# Patient Record
Sex: Female | Born: 1960 | Race: White | Hispanic: No | Marital: Married | Smoking: Never smoker
Health system: Southern US, Community
[De-identification: ages and names within clinical notes are randomized; demographics above are authoritative.]

## PROBLEM LIST (undated history)

## (undated) DIAGNOSIS — I10 Essential (primary) hypertension: Secondary | ICD-10-CM

## (undated) DIAGNOSIS — M797 Fibromyalgia: Secondary | ICD-10-CM

---

## 2011-08-17 ENCOUNTER — Encounter (HOSPITAL_COMMUNITY): Payer: Self-pay | Admitting: *Deleted

## 2011-08-17 ENCOUNTER — Emergency Department (HOSPITAL_COMMUNITY)
Admission: EM | Admit: 2011-08-17 | Discharge: 2011-08-17 | Disposition: A | Discharge status: Home / Self Care | Payer: Federal, State, Local not specified - PPO | Attending: Emergency Medicine | Admitting: Emergency Medicine

## 2011-08-17 ENCOUNTER — Other Ambulatory Visit: Payer: Self-pay

## 2011-08-17 ENCOUNTER — Emergency Department (HOSPITAL_COMMUNITY): Payer: Federal, State, Local not specified - PPO

## 2011-08-17 DIAGNOSIS — R61 Generalized hyperhidrosis: Secondary | ICD-10-CM | POA: Insufficient documentation

## 2011-08-17 DIAGNOSIS — J45909 Unspecified asthma, uncomplicated: Secondary | ICD-10-CM | POA: Insufficient documentation

## 2011-08-17 DIAGNOSIS — R079 Chest pain, unspecified: Secondary | ICD-10-CM | POA: Insufficient documentation

## 2011-08-17 DIAGNOSIS — I10 Essential (primary) hypertension: Secondary | ICD-10-CM | POA: Insufficient documentation

## 2011-08-17 DIAGNOSIS — Z79899 Other long term (current) drug therapy: Secondary | ICD-10-CM | POA: Insufficient documentation

## 2011-08-17 DIAGNOSIS — R51 Headache: Secondary | ICD-10-CM | POA: Insufficient documentation

## 2011-08-17 DIAGNOSIS — R1013 Epigastric pain: Secondary | ICD-10-CM | POA: Insufficient documentation

## 2011-08-17 DIAGNOSIS — M546 Pain in thoracic spine: Secondary | ICD-10-CM | POA: Insufficient documentation

## 2011-08-17 HISTORY — DX: Essential (primary) hypertension: I10

## 2011-08-17 HISTORY — DX: Fibromyalgia: M79.7

## 2011-08-17 LAB — CBC
HCT: 35.8 % — ABNORMAL LOW (ref 36.0–46.0)
Hemoglobin: 11.5 g/dL — ABNORMAL LOW (ref 12.0–15.0)
MCH: 27.6 pg (ref 26.0–34.0)
MCHC: 32.1 g/dL (ref 30.0–36.0)
MCV: 85.9 fL (ref 78.0–100.0)
RBC: 4.17 MIL/uL (ref 3.87–5.11)

## 2011-08-17 LAB — BASIC METABOLIC PANEL
BUN: 13 mg/dL (ref 6–23)
Creatinine, Ser: 0.87 mg/dL (ref 0.50–1.10)
GFR calc non Af Amer: 77 mL/min — ABNORMAL LOW (ref >90)
Glucose, Bld: 120 mg/dL — ABNORMAL HIGH (ref 70–99)
Potassium: 4.2 mEq/L (ref 3.5–5.1)

## 2011-08-17 LAB — DIFFERENTIAL
Basophils Relative: 2 % — ABNORMAL HIGH (ref 0–1)
Eosinophils Absolute: 0 10*3/uL (ref 0.0–0.7)
Eosinophils Relative: 0 % (ref 0–5)
Lymphs Abs: 1.7 10*3/uL (ref 0.7–4.0)
Monocytes Absolute: 0.3 10*3/uL (ref 0.1–1.0)
Monocytes Relative: 5 % (ref 3–12)

## 2011-08-17 LAB — HEPATIC FUNCTION PANEL
AST: 21 U/L (ref 0–37)
Bilirubin, Direct: 0.1 mg/dL (ref 0.0–0.3)
Indirect Bilirubin: 0.3 mg/dL (ref 0.3–0.9)
Total Bilirubin: 0.4 mg/dL (ref 0.3–1.2)

## 2011-08-17 MED ORDER — ONDANSETRON HCL 4 MG/2ML IJ SOLN
4.0000 mg | Freq: Once | INTRAMUSCULAR | Status: AC
  Administered 2011-08-17: 4 mg via INTRAVENOUS
  Filled 2011-08-17: qty 2

## 2011-08-17 MED ORDER — SODIUM CHLORIDE 0.9 % IV SOLN: Freq: Once | INTRAVENOUS | Status: DC

## 2011-08-17 MED ORDER — PANTOPRAZOLE SODIUM 40 MG IV SOLR
40.0000 mg | Freq: Once | INTRAVENOUS | Status: AC
  Administered 2011-08-17: 40 mg via INTRAVENOUS
  Filled 2011-08-17: qty 40

## 2011-08-17 MED ORDER — ALBUTEROL SULFATE (5 MG/ML) 0.5% IN NEBU: 5.0000 mg | INHALATION_SOLUTION | Freq: Once | RESPIRATORY_TRACT | Status: DC

## 2011-08-17 MED ORDER — HYDROCODONE-ACETAMINOPHEN 5-325 MG PO TABS: 1.0000 | ORAL_TABLET | Freq: Four times a day (QID) | ORAL | Status: AC | PRN

## 2011-08-17 MED ORDER — MORPHINE SULFATE 4 MG/ML IJ SOLN
4.0000 mg | Freq: Once | INTRAMUSCULAR | Status: AC
  Administered 2011-08-17: 4 mg via INTRAVENOUS
  Filled 2011-08-17: qty 1

## 2011-08-17 MED ORDER — HYDROMORPHONE HCL PF 1 MG/ML IJ SOLN
INTRAMUSCULAR | Status: AC
  Administered 2011-08-17: 1 mg
  Filled 2011-08-17: qty 1

## 2011-08-17 NOTE — Discharge Instructions (Signed)
Follow up with your md as scheduled this week.  Increase your  prilosec to twice a day for 3 days

## 2011-08-17 NOTE — ED Notes (Signed)
Pt in xray

## 2011-08-17 NOTE — ED Notes (Signed)
Pt states CP began last night. Hurts worse with movement and taking a deep breath. Denies pain at present.

## 2011-08-17 NOTE — ED Provider Notes (Signed)
This chart was scribed for Benny Lennert, MD by Wallis Mart. The patient was seen in room APA12/[facility]12 and the patient's care was started at 11:59 AM.   CSN: [id number]  Arrival date & time 08/17/11  1122   First MD Initiated Contact with Patient 08/17/11 1148      Chief Complaint  Patient presents with  . Chest Pain    (Consider location/radiation/quality/duration/timing/severity/associated sxs/prior treatment) Patient is a 51 y.o. female presenting with chest pain. The history is provided by the patient.  Chest Pain The chest pain began yesterday. Chest pain occurs intermittently. The chest pain is improving. The pain is associated with breathing and exertion. The severity of the pain is moderate. The pain radiates to the mid back. Chest pain is worsened by deep breathing and exertion. Pertinent negatives for primary symptoms include no fever, no fatigue, no shortness of breath, no cough, no abdominal pain, no nausea, no vomiting and no dizziness.  Associated symptoms include diaphoresis.  Pertinent negatives for associated symptoms include no numbness. She tried aspirin for the symptoms. Risk factors include obesity and stress.  Her past medical history is significant for hypertension.  Pertinent negatives for past medical history include no seizures.      Charonda Hefter is a 51 y.o. female who presents to the Emergency Department complaining of sudden onset, episodic, gradually improving, moderate chest pain onset last night. The pain radiates to her back and worsens with movement and taking a deep breath.  Pt also c/o headache that started yesterday afternoon. Pt c/o diaphoresis upon waking up this morning. Pt w/ h/o fibromyalgia states that she has been doing too much physically, has not been able to sleep due to pain. Pt states that pain has subsided from last night, but is still uncomfortable.    PCP: Dr. Jonathon Jordan? and Dr. Merleen Milliner   Past Medical History  Diagnosis Date  .  Asthma   . Hypertension   . Fibromyalgia     Past Surgical History  Procedure Date  . Cesarean section     No family history on file.  History  Substance Use Topics  . Smoking status: Never Smoker   . Smokeless tobacco: Not on file  . Alcohol Use: Yes     Occ    OB History    Grav Para Term Preterm Abortions TAB SAB Ect Mult Living                  Review of Systems  Constitutional: Positive for diaphoresis. Negative for fever and fatigue.  HENT: Negative for congestion, sinus pressure and ear discharge.   Eyes: Negative for discharge.  Respiratory: Negative for cough and shortness of breath.   Cardiovascular: Positive for chest pain.  Gastrointestinal: Negative for nausea, vomiting, abdominal pain and diarrhea.  Genitourinary: Negative for frequency and hematuria.  Musculoskeletal: Negative for back pain.  Skin: Negative for rash.  Neurological: Negative for dizziness, seizures, numbness and headaches.  Hematological: Negative.   Psychiatric/Behavioral: Negative for hallucinations.  All other systems reviewed and are negative.    Allergies  Bee venom; Aspirin; and Demerol  Home Medications   Current Outpatient Rx  Name Route Sig Dispense Refill  . CETIRIZINE HCL 10 MG PO TABS Oral Take 10 mg by mouth every morning.    Marland Kitchen VITAMIN D 1000 UNITS PO TABS Oral Take 1,000 Units by mouth daily.    Marland Kitchen EPINEPHRINE 0.3 MG/0.3ML IJ DEVI Intramuscular Inject 0.3 mg into the muscle daily as needed. For bee  venom    . VITAMIN E 400 UNITS PO CAPS Oral Take 400 Units by mouth daily.      BP 159/99  Pulse 79  Temp 98.7 F (37.1 C)  Resp 17  Ht 5\' 1"  (1.549 m)  Wt 180 lb (81.647 kg)  BMI 34.01 kg/m2  SpO2 100%  LMP 08/17/2011  Physical Exam  Constitutional: She is oriented to person, place, and time. She appears well-developed.  HENT:  Head: Normocephalic and atraumatic.  Eyes: Conjunctivae and EOM are normal. No scleral icterus.  Neck: Neck supple. No thyromegaly  present.  Cardiovascular: Normal rate and regular rhythm.  Exam reveals no gallop and no friction rub.   No murmur heard. Pulmonary/Chest: No stridor. She has no wheezes. She has no rales. She exhibits no tenderness.  Abdominal: She exhibits no distension. There is no tenderness. There is no rebound.       Mild epigastric tenderness  Musculoskeletal: Normal range of motion. She exhibits no edema.  Lymphadenopathy:    She has no cervical adenopathy.  Neurological: She is oriented to person, place, and time. Coordination normal.  Skin: No rash noted. No erythema.  Psychiatric: She has a normal mood and affect. Her behavior is normal.    ED Course  Procedures (including critical care time) DIAGNOSTIC STUDIES: Oxygen Saturation is 99% on room air, normal by my interpretation.    COORDINATION OF CARE:    Labs Reviewed  CBC - Abnormal; Notable for the following:    Hemoglobin 11.5 (*)    HCT 35.8 (*)    All other components within normal limits  DIFFERENTIAL - Abnormal; Notable for the following:    Basophils Relative 2 (*)    All other components within normal limits  BASIC METABOLIC PANEL - Abnormal; Notable for the following:    Glucose, Bld 120 (*)    GFR calc non Af Amer 77 (*)    GFR calc Af Amer 89 (*)    All other components within normal limits  HEPATIC FUNCTION PANEL - Abnormal; Notable for the following:    Albumin 3.2 (*)    All other components within normal limits  LIPASE, BLOOD   Dg Abd Acute W/chest  08/17/2011  *RADIOLOGY REPORT*  Clinical Data: Nausea and chest pain.  ACUTE ABDOMEN SERIES (ABDOMEN 2 VIEW & CHEST 1 VIEW)  Comparison: None.  Findings: Borderline cardiomegaly is present.  The lungs are clear.  Supine and upright views of the abdomen demonstrate nonspecific bowel gas pattern.  There is no evidence for obstruction or free air.  The axial skeleton is unremarkable.  IMPRESSION:  1.  Borderline cardiomegaly without failure. 2.  No acute abnormality of  the chest or abdomen.  Original Report Authenticated By: Jamesetta Orleans. MATTERN, M.D.     No diagnosis found.  Date: 08/17/2011  Rate:78  Rhythm: normal sinus rhythm  QRS Axis: normal  Intervals: normal  ST/T Wave abnormalities: normal  Conduction Disutrbances:none  Narrative Interpretation:   Old EKG Reviewed: none available     MDM  Fibromyalgia pain  The chart was scribed for me under my direct supervision.  I personally performed the history, physical, and medical decision making and all procedures in the evaluation of this patient.Benny Lennert, MD 08/17/11 1420

## 2018-12-10 ENCOUNTER — Other Ambulatory Visit: Payer: Self-pay | Admitting: *Deleted

## 2018-12-10 DIAGNOSIS — Z20822 Contact with and (suspected) exposure to covid-19: Secondary | ICD-10-CM

## 2018-12-11 LAB — NOVEL CORONAVIRUS, NAA: SARS-CoV-2, NAA: NOT DETECTED

## 2018-12-16 ENCOUNTER — Telehealth: Payer: Self-pay | Admitting: General Practice

## 2018-12-16 NOTE — Telephone Encounter (Signed)
Negative COVID results given. Patient results "NOT Detected." Caller expressed understanding. ° °

## 2020-04-20 ENCOUNTER — Emergency Department (HOSPITAL_COMMUNITY)
Admission: EM | Admit: 2020-04-20 | Discharge: 2020-04-20 | Disposition: A | Discharge status: Home / Self Care | Payer: Federal, State, Local not specified - PPO | Attending: Emergency Medicine | Admitting: Emergency Medicine

## 2020-04-20 ENCOUNTER — Other Ambulatory Visit: Payer: Self-pay

## 2020-04-20 ENCOUNTER — Emergency Department (HOSPITAL_COMMUNITY): Payer: Federal, State, Local not specified - PPO

## 2020-04-20 ENCOUNTER — Encounter (HOSPITAL_COMMUNITY): Payer: Self-pay | Admitting: *Deleted

## 2020-04-20 DIAGNOSIS — J45909 Unspecified asthma, uncomplicated: Secondary | ICD-10-CM | POA: Insufficient documentation

## 2020-04-20 DIAGNOSIS — I10 Essential (primary) hypertension: Secondary | ICD-10-CM | POA: Insufficient documentation

## 2020-04-20 DIAGNOSIS — Z79899 Other long term (current) drug therapy: Secondary | ICD-10-CM | POA: Insufficient documentation

## 2020-04-20 DIAGNOSIS — R079 Chest pain, unspecified: Secondary | ICD-10-CM | POA: Insufficient documentation

## 2020-04-20 DIAGNOSIS — R0789 Other chest pain: Secondary | ICD-10-CM

## 2020-04-20 DIAGNOSIS — Z7901 Long term (current) use of anticoagulants: Secondary | ICD-10-CM | POA: Diagnosis not present

## 2020-04-20 DIAGNOSIS — R0602 Shortness of breath: Secondary | ICD-10-CM | POA: Diagnosis present

## 2020-04-20 LAB — BASIC METABOLIC PANEL
Anion gap: 12 (ref 5–15)
BUN: 31 mg/dL — ABNORMAL HIGH (ref 6–20)
CO2: 28 mmol/L (ref 22–32)
Calcium: 9.9 mg/dL (ref 8.9–10.3)
Chloride: 101 mmol/L (ref 98–111)
Creatinine, Ser: 1.05 mg/dL — ABNORMAL HIGH (ref 0.44–1.00)
GFR, Estimated: >60 mL/min (ref >60)
Glucose, Bld: 98 mg/dL (ref 70–99)
Potassium: 4.5 mmol/L (ref 3.5–5.1)
Sodium: 141 mmol/L (ref 135–145)

## 2020-04-20 LAB — CBC
HCT: 40.4 % (ref 36.0–46.0)
Hemoglobin: 12.8 g/dL (ref 12.0–15.0)
MCH: 28.8 pg (ref 26.0–34.0)
MCHC: 31.7 g/dL (ref 30.0–36.0)
MCV: 90.8 fL (ref 80.0–100.0)
Platelets: 330 10*3/uL (ref 150–400)
RBC: 4.45 MIL/uL (ref 3.87–5.11)
RDW: 13.5 % (ref 11.5–15.5)
WBC: 8.1 10*3/uL (ref 4.0–10.5)
nRBC: 0 % (ref 0.0–0.2)

## 2020-04-20 LAB — TROPONIN I (HIGH SENSITIVITY)
Troponin I (High Sensitivity): 3 ng/L (ref <18)
Troponin I (High Sensitivity): 3 ng/L (ref <18)

## 2020-04-20 MED ORDER — ALBUTEROL SULFATE (2.5 MG/3ML) 0.083% IN NEBU: 2.5000 mg | INHALATION_SOLUTION | RESPIRATORY_TRACT | 2 refills | Status: AC | PRN

## 2020-04-20 MED ORDER — ALBUTEROL SULFATE HFA 108 (90 BASE) MCG/ACT IN AERS
2.0000 | INHALATION_SPRAY | RESPIRATORY_TRACT | Status: DC
  Administered 2020-04-20: 2 via RESPIRATORY_TRACT
  Filled 2020-04-20: qty 6.7

## 2020-04-20 MED ORDER — PREDNISONE 50 MG PO TABS: ORAL_TABLET | ORAL | 0 refills | Status: DC

## 2020-04-20 MED ORDER — ALBUTEROL SULFATE HFA 108 (90 BASE) MCG/ACT IN AERS: 2.0000 | INHALATION_SPRAY | Freq: Four times a day (QID) | RESPIRATORY_TRACT | 2 refills | Status: DC | PRN

## 2020-04-20 MED ORDER — PREDNISONE 50 MG PO TABS
60.0000 mg | ORAL_TABLET | Freq: Once | ORAL | Status: AC
  Administered 2020-04-20: 60 mg via ORAL
  Filled 2020-04-20: qty 1

## 2020-04-20 MED ORDER — PREDNISONE 50 MG PO TABS: ORAL_TABLET | ORAL | 0 refills | Status: AC

## 2020-04-20 MED ORDER — ALBUTEROL SULFATE HFA 108 (90 BASE) MCG/ACT IN AERS: 2.0000 | INHALATION_SPRAY | Freq: Four times a day (QID) | RESPIRATORY_TRACT | 2 refills | Status: AC | PRN

## 2020-04-20 NOTE — ED Notes (Signed)
Patient discharged to home.  All discharge instructions reviewed.  Patient verbalized understanding via teachback method.  Ambulatory out of ED.   °

## 2020-04-20 NOTE — ED Provider Notes (Signed)
[facility] EMERGENCY DEPARTMENT Provider Note   CSN: [id number] Arrival date & time: 04/20/20  1845     History Chief Complaint  Patient presents with  . Shortness of Breath    Holly Crosby is a 60 y.o. female.  Pt complains of pain in the right side of her chest and feeling short of breath  Pt was diagnosed with covid on 1/2.  Pt reports she has a history of bronchitis and asthma.  Pt is out of albuterol.    The history is provided by the patient. No language interpreter was used.  Shortness of Breath Severity:  Moderate Timing:  Constant Progression:  Worsening Chronicity:  New Relieved by:  Nothing Worsened by:  Nothing Ineffective treatments:  None tried Associated symptoms: chest pain   Risk factors: no recent alcohol use and no hx of PE/DVT    Pt reports she was treated with paxlovid.      Past Medical History:  Diagnosis Date  . Asthma   . Fibromyalgia   . Hypertension     There are no problems to display for this patient.   Past Surgical History:  Procedure Laterality Date  . CESAREAN SECTION       OB History   No obstetric history on file.     History reviewed. No pertinent family history.  Social History   Tobacco Use  . Smoking status: Never Smoker  . Smokeless tobacco: Never Used  Vaping Use  . Vaping Use: Never used  Substance Use Topics  . Alcohol use: Yes    Comment: Occ  . Drug use: No    Home Medications Prior to Admission medications   Medication Sig Start Date End Date Taking? Authorizing Provider  carvedilol (COREG) 25 MG tablet Take 25 mg by mouth 2 (two) times daily with a meal.    [provider]  cetirizine (ZYRTEC) 10 MG tablet Take 10 mg by mouth every morning.    [provider]  cholecalciferol (VITAMIN D) 1000 UNITS tablet Take 2,000 Units by mouth daily.     [provider]  EPINEPHrine (EPI-PEN) 0.3 mg/0.3 mL DEVI Inject 0.3 mg into the muscle daily as needed. For bee venom     [provider]  Fe Fum-FA-B Cmp-C-Zn-Mg-Mn-Cu (HEMATINIC PLUS VIT/MINERALS) 106-1 MG TABS Take 1 tablet by mouth 2 (two) times daily.    [provider]  FLUoxetine (PROZAC) 20 MG capsule Take 20 mg by mouth daily.    [provider]  Fluticasone-Salmeterol (ADVAIR) 250-50 MCG/DOSE AEPB Inhale 1 puff into the lungs every 12 (twelve) hours.    [provider]  furosemide (LASIX) 20 MG tablet Take 20 mg by mouth every morning.     [provider]  gabapentin (NEURONTIN) 300 MG capsule Take 300 mg by mouth 3 (three) times daily.    [provider]  lisinopril (PRINIVIL,ZESTRIL) 20 MG tablet Take 40 mg by mouth daily.    [provider]  Milnacipran HCl (SAVELLA) 100 MG TABS tablet Take 100 mg by mouth 2 (two) times daily.    [provider]  montelukast (SINGULAIR) 10 MG tablet Take 10 mg by mouth every morning.     [provider]  norgestimate-ethinyl estradiol (ORTHO-CYCLEN,SPRINTEC,PREVIFEM) 0.25-35 MG-MCG tablet Take 1 tablet by mouth at bedtime.     [provider]  omeprazole (PRILOSEC) 40 MG capsule Take 40 mg by mouth every morning.     [provider]  vitamin E 400 UNIT capsule Take  400 Units by mouth every morning.     [provider]    Allergies    Bee venom, Aspirin, and Demerol [meperidine]  Review of Systems   Review of Systems  Respiratory: Positive for shortness of breath.   Cardiovascular: Positive for chest pain.  All other systems reviewed and are negative.   Physical Exam Updated Vital Signs BP (!) 150/68   Pulse 68   Temp 98.5 F (36.9 C) (Oral)   Resp 19   Ht 5\' 1"  (1.549 m)   Wt 90.3 kg   LMP 08/17/2011   SpO2 99%   BMI 37.60 kg/m   Physical Exam Vitals and nursing note reviewed.  Constitutional:      Appearance: She is well-developed and well-nourished.  HENT:     Head: Normocephalic.  Eyes:     Extraocular Movements: EOM normal.   Cardiovascular:     Rate and Rhythm: Normal rate and regular rhythm.  Pulmonary:     Effort: Pulmonary effort is normal.     Breath sounds: No decreased breath sounds.  Chest:     Chest wall: No mass or tenderness.  Abdominal:     General: There is no distension.  Musculoskeletal:        General: Normal range of motion.     Cervical back: Normal range of motion.  Skin:    General: Skin is warm.  Neurological:     General: No focal deficit present.     Mental Status: She is alert and oriented to person, place, and time.  Psychiatric:        Mood and Affect: Mood and affect and mood normal.     ED Results / Procedures / Treatments   Labs (all labs ordered are listed, but only abnormal results are displayed) Labs Reviewed  BASIC METABOLIC PANEL - Abnormal; Notable for the following components:      Result Value   BUN 31 (*)    Creatinine, Ser 1.05 (*)    All other components within normal limits  CBC  TROPONIN I (HIGH SENSITIVITY)  TROPONIN I (HIGH SENSITIVITY)    EKG EKG Interpretation  Date/Time:  Friday April 20 2020 19:06:57 EST Ventricular Rate:  73 PR Interval:  200 QRS Duration: 96 QT Interval:  366 QTC Calculation: 403 R Axis:   73 Text Interpretation: Normal sinus rhythm Normal ECG No STEMI Confirmed by 03-29-1986 [id number]) on 04/20/2020 7:35:59 PM   Radiology DG Chest 2 View  Result Date: 04/20/2020 CLINICAL DATA:  Shortness of breath and chest heaviness today. COVID positive 04/08/2020. EXAM: CHEST - 2 VIEW COMPARISON:  None. FINDINGS: The heart size and mediastinal contours are within normal limits. Small hiatal hernia. No focal consolidation. No pleural effusion or pneumothorax. The visualized skeletal structures are unremarkable. IMPRESSION: No active cardiopulmonary disease. Electronically Signed   By: 06/06/2020 MD   On: 04/20/2020 19:31    Procedures Procedures (including critical care time)  Medications Ordered in ED Medications - No  data to display  ED Course  I have reviewed the triage vital signs and the nursing notes.  Pertinent labs & imaging results that were available during my care of the patient were reviewed by me and considered in my medical decision making (see chart for details).    MDM Rules/Calculators/A&P                          MDM:  Ekg and troponin are normal.  Chest xray no pneumonia.  Pt has elevation of bun and creatine.  Pt advised to have her MD recheck.  Increase oral fluids.  Pt given rx for prednisone and albuterol.  Final Clinical Impression(s) / ED Diagnoses Final diagnoses:  Chest pain    Rx / DC Orders ED Discharge Orders         Ordered    predniSONE (DELTASONE) 50 MG tablet        04/20/20 2131    albuterol (VENTOLIN HFA) 108 (90 Base) MCG/ACT inhaler  Every 6 hours PRN        04/20/20 2131        An After Visit Summary was printed and given to the patient.    Elson Areas, Cordelia Poche 04/20/20 2132    Maia Plan, MD 04/21/20 1356

## 2020-04-20 NOTE — Discharge Instructions (Addendum)
Return if any problems.

## 2020-04-20 NOTE — ED Triage Notes (Signed)
Pt with recent Covid, dx'd on 1/2.  Pt with SOB and CP for past few days.

## 2021-07-05 IMAGING — DX DG CHEST 2V
2 series · 2 of 2 positions shown · non-contrast
Comparison: None.

CLINICAL DATA: Shortness of breath and chest heaviness today. COVID
positive 04/08/2020.

EXAM:
CHEST - 2 VIEW

[chest pa]
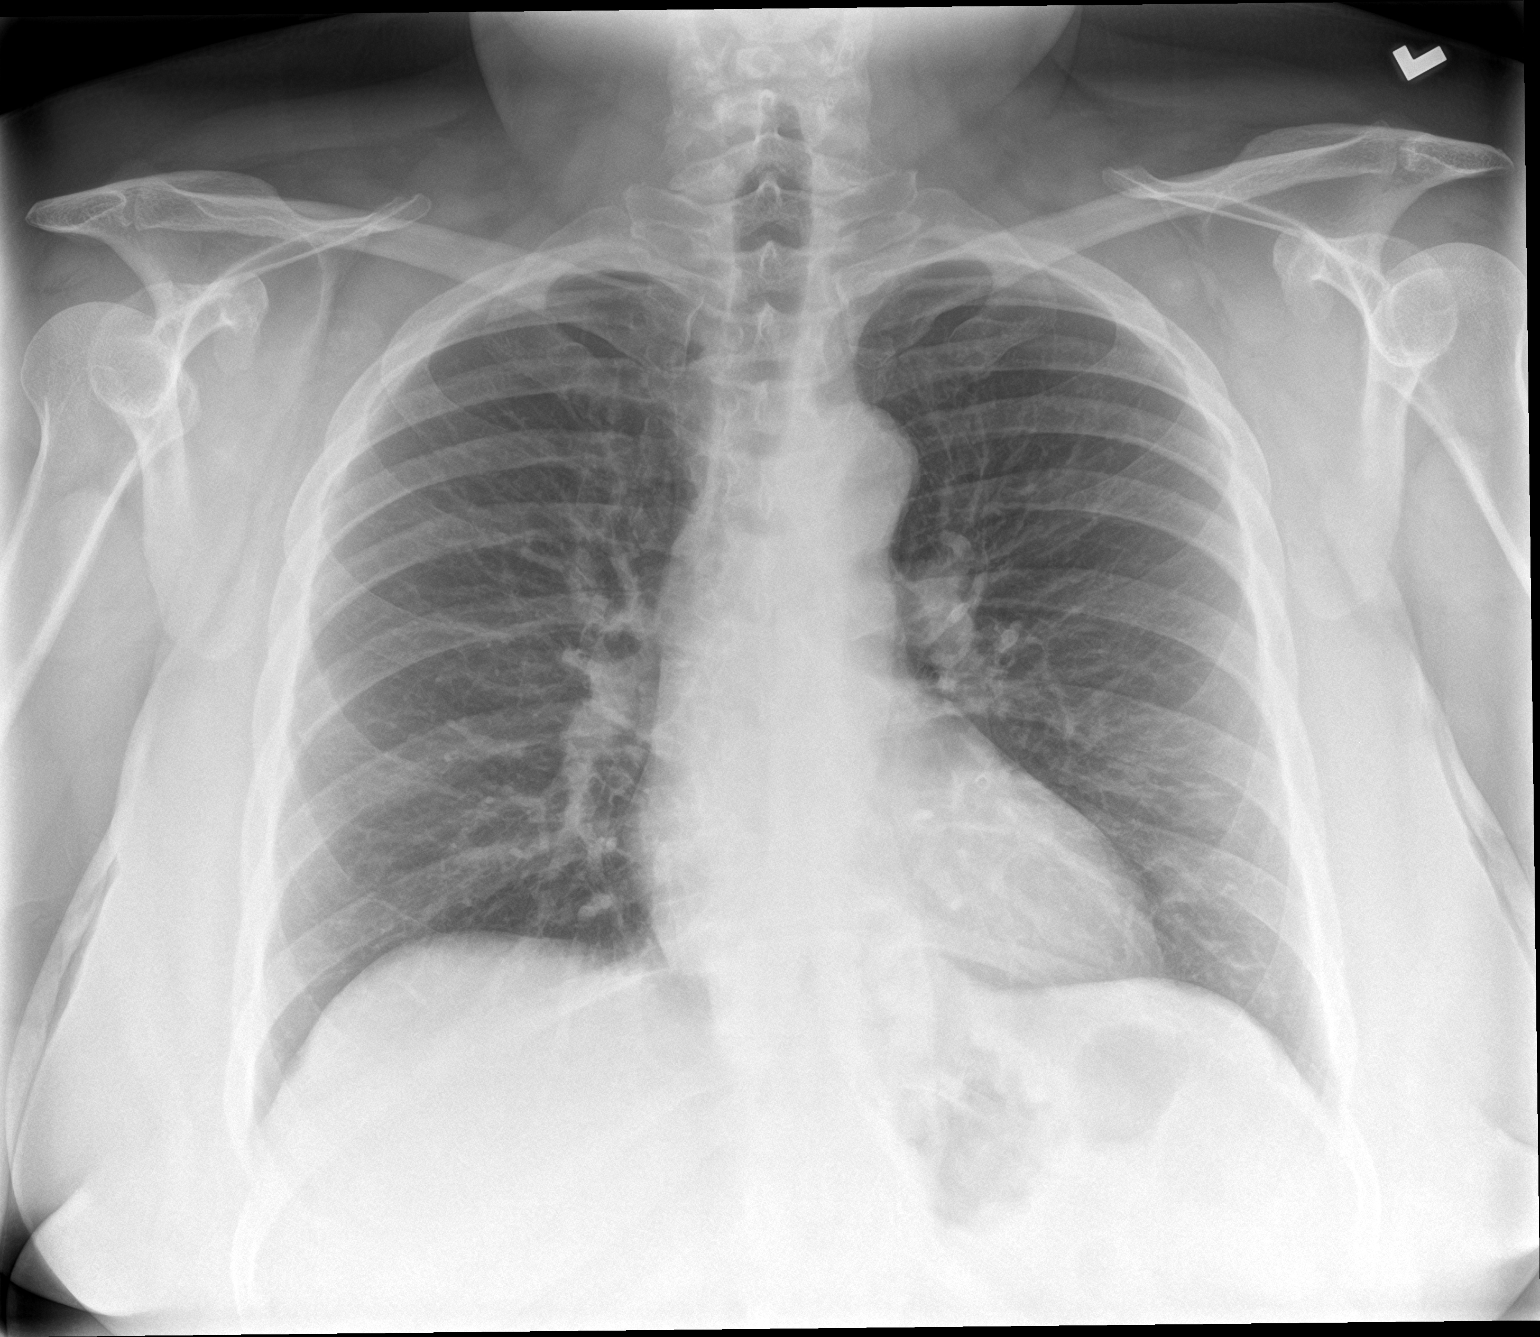

[chest lat]
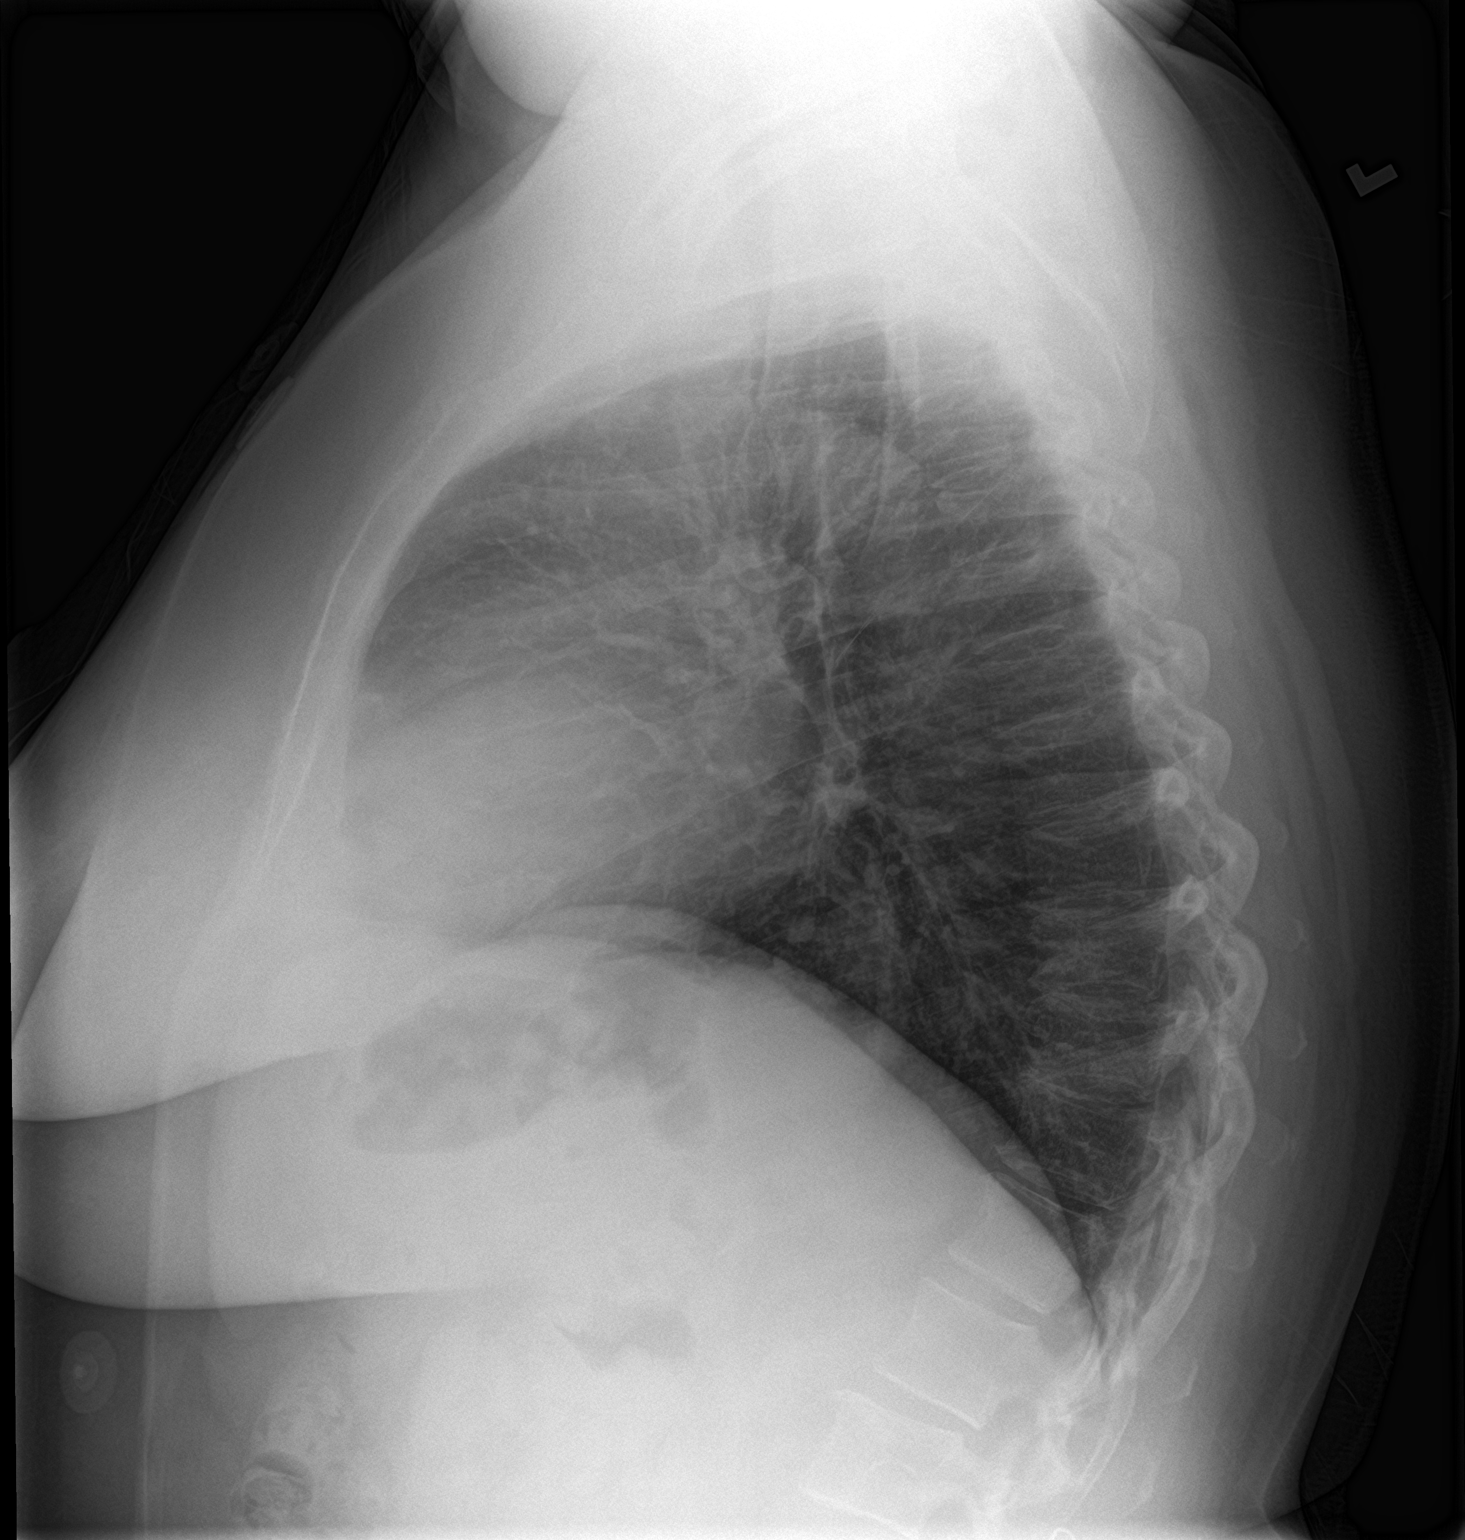

[2 of 2 positions shown; findings below may reference images not displayed]

FINDINGS: The heart size and mediastinal contours are within normal limits.
Small hiatal hernia. No focal consolidation. No pleural effusion or
pneumothorax. The visualized skeletal structures are unremarkable.
IMPRESSION: No active cardiopulmonary disease.
